# Patient Record
Sex: Male | Born: 1955 | Race: White | Hispanic: No | Marital: Married | Smoking: Current every day smoker
Health system: Southern US, Community
[De-identification: ages and names within clinical notes are randomized; demographics above are authoritative.]

## PROBLEM LIST (undated history)

## (undated) DIAGNOSIS — IMO0001 Reserved for inherently not codable concepts without codable children: Secondary | ICD-10-CM

## (undated) DIAGNOSIS — E7800 Pure hypercholesterolemia, unspecified: Secondary | ICD-10-CM

## (undated) DIAGNOSIS — R251 Tremor, unspecified: Secondary | ICD-10-CM

## (undated) DIAGNOSIS — K5792 Diverticulitis of intestine, part unspecified, without perforation or abscess without bleeding: Secondary | ICD-10-CM

## (undated) DIAGNOSIS — I10 Essential (primary) hypertension: Secondary | ICD-10-CM

## (undated) DIAGNOSIS — E785 Hyperlipidemia, unspecified: Secondary | ICD-10-CM

## (undated) DIAGNOSIS — D696 Thrombocytopenia, unspecified: Secondary | ICD-10-CM

## (undated) DIAGNOSIS — K635 Polyp of colon: Secondary | ICD-10-CM

## (undated) DIAGNOSIS — I2510 Atherosclerotic heart disease of native coronary artery without angina pectoris: Secondary | ICD-10-CM

## (undated) DIAGNOSIS — N200 Calculus of kidney: Secondary | ICD-10-CM

## (undated) HISTORY — DX: Essential (primary) hypertension: I10

## (undated) HISTORY — DX: Hyperlipidemia, unspecified: E78.5

## (undated) HISTORY — PX: CARDIAC CATHETERIZATION: SHX172

## (undated) HISTORY — DX: Tremor, unspecified: R25.1

## (undated) HISTORY — DX: Diverticulitis of intestine, part unspecified, without perforation or abscess without bleeding: K57.92

## (undated) HISTORY — DX: Atherosclerotic heart disease of native coronary artery without angina pectoris: I25.10

## (undated) HISTORY — DX: Thrombocytopenia, unspecified: D69.6

## (undated) HISTORY — DX: Reserved for inherently not codable concepts without codable children: IMO0001

## (undated) HISTORY — DX: Calculus of kidney: N20.0

## (undated) HISTORY — DX: Pure hypercholesterolemia, unspecified: E78.00

## (undated) HISTORY — DX: Polyp of colon: K63.5

---

## 1988-12-27 HISTORY — PX: VASECTOMY: SHX75

## 2003-01-31 ENCOUNTER — Ambulatory Visit (HOSPITAL_COMMUNITY): Admission: RE | Admit: 2003-01-31 | Discharge: 2003-01-31 | Payer: Self-pay | Admitting: Gastroenterology

## 2006-05-15 ENCOUNTER — Encounter: Admission: RE | Admit: 2006-05-15 | Discharge: 2006-05-15 | Payer: Self-pay | Admitting: Cardiology

## 2006-05-25 ENCOUNTER — Ambulatory Visit (HOSPITAL_COMMUNITY): Admission: RE | Admit: 2006-05-25 | Discharge: 2006-05-25 | Payer: Self-pay | Admitting: Cardiology

## 2013-03-19 ENCOUNTER — Encounter: Payer: Self-pay | Admitting: *Deleted

## 2013-03-19 ENCOUNTER — Encounter: Payer: Self-pay | Admitting: Cardiology

## 2013-03-19 DIAGNOSIS — D696 Thrombocytopenia, unspecified: Secondary | ICD-10-CM | POA: Insufficient documentation

## 2013-03-19 DIAGNOSIS — E785 Hyperlipidemia, unspecified: Secondary | ICD-10-CM | POA: Insufficient documentation

## 2013-03-19 DIAGNOSIS — K635 Polyp of colon: Secondary | ICD-10-CM | POA: Insufficient documentation

## 2013-03-19 DIAGNOSIS — I10 Essential (primary) hypertension: Secondary | ICD-10-CM | POA: Insufficient documentation

## 2013-03-19 DIAGNOSIS — R251 Tremor, unspecified: Secondary | ICD-10-CM | POA: Insufficient documentation

## 2013-03-19 DIAGNOSIS — E7800 Pure hypercholesterolemia, unspecified: Secondary | ICD-10-CM | POA: Insufficient documentation

## 2013-03-19 DIAGNOSIS — N200 Calculus of kidney: Secondary | ICD-10-CM | POA: Insufficient documentation

## 2013-03-19 DIAGNOSIS — I2510 Atherosclerotic heart disease of native coronary artery without angina pectoris: Secondary | ICD-10-CM | POA: Insufficient documentation

## 2013-03-19 DIAGNOSIS — IMO0001 Reserved for inherently not codable concepts without codable children: Secondary | ICD-10-CM | POA: Insufficient documentation

## 2013-03-22 ENCOUNTER — Encounter: Payer: Self-pay | Admitting: Cardiology

## 2013-03-22 ENCOUNTER — Ambulatory Visit (INDEPENDENT_AMBULATORY_CARE_PROVIDER_SITE_OTHER): Payer: BC Managed Care – PPO | Admitting: Cardiology

## 2013-03-22 VITALS — BP 155/93 | HR 76 | Ht 71.0 in | Wt 191.0 lb

## 2013-03-22 DIAGNOSIS — I10 Essential (primary) hypertension: Secondary | ICD-10-CM

## 2013-03-22 DIAGNOSIS — I2510 Atherosclerotic heart disease of native coronary artery without angina pectoris: Secondary | ICD-10-CM

## 2013-03-22 DIAGNOSIS — E7800 Pure hypercholesterolemia, unspecified: Secondary | ICD-10-CM

## 2013-03-22 NOTE — Progress Notes (Signed)
  [address] [place], [place]  [address] Phone: [phone] Fax:  [phone]  Date:  03/22/2013   ID:  HABEEB PUERTAS, DOB 1955/06/27, MRN [id number]  PCP:  No primary Crissie Aloi on file.  Cardiologist:  Armanda Magic, MD   History of Present Illness: Roberto Tucker is a 57 y.o. male with a history of ASCAD, HTN and dyslipidemia.  He denies and chest pain, LE edema, dizziness, palpitations or syncope.  He has chronic SOB due to smoking which is unchanged.   Wt Readings from Last 3 Encounters:  03/22/13 191 lb (86.637 kg)     Past Medical History  Diagnosis Date  . Hypercholesteremia   . Coronary atherosclerosis of native coronary artery   . HTN (hypertension)   . Myalgia and myositis, unspecified   . Colon polyp   . Tremor   . Thrombocytopenia   . CAD (coronary artery disease)     nonobstructive CAD followed by cardiologist Dr. Mayford Knife, coronary angiography in January 2008 because of abnormal Cardiolite test, showing 20% ostial narrowing and luminal irregularities throughout the course of the LAD and RCA, ejection fraction 55%  . Hyperlipidemia   . Kidney stone     Current Outpatient Prescriptions  Medication Sig Dispense Refill  . aspirin 81 MG tablet Take 81 mg by mouth daily.      Marland Kitchen atorvastatin (LIPITOR) 80 MG tablet Take 80 mg by mouth daily.      . propranolol (INDERAL) 10 MG tablet Take 10 mg by mouth 2 (two) times daily.        No current facility-administered medications for this visit.    Allergies:   No Known Allergies  Social History:  The patient  reports that he has been smoking.  He does not have any smokeless tobacco history on file. He reports that he drinks about 6.0 ounces of alcohol per week. He reports that he does not use illicit drugs.   Family History:  The patient's family history includes Colon cancer in his mother; Heart attack in his father; Heart disease in his father.   ROS:  Please see the history of present illness.      All other  systems reviewed and negative.   PHYSICAL EXAM: VS:  BP 155/93  Pulse 76  Ht 5\' 11"  (1.803 m)  Wt 191 lb (86.637 kg)  BMI 26.65 kg/m2 Well nourished, well developed, in no acute distress HEENT: normal Neck: no JVD Cardiac:  normal S1, S2; RRR; no murmur Lungs:  clear to auscultation bilaterally, no wheezing, rhonchi or rales Abd: soft, nontender, no hepatomegaly Ext: no edema Skin: warm and dry Neuro:  CNs 2-12 intact, no focal abnormalities noted  EKG:  NSR with no ST changes     ASSESSMENT AND PLAN:  1. ASCAD with no angina  - continue ASA 2. HTN - BP elevated today but at home it was 128/52mmHg  - continue Inderal 3. Dyslipidemia  - continue atorvastatin  - check fasting lipid panel and ALT 4.  Tobacco abuse ongoing - he has tried all the smoking cessation drugs and does not want to try Chantix  Followup with me in 1 year  Signed, Armanda Magic, MD 03/22/2013 1:49 PM

## 2013-03-22 NOTE — Patient Instructions (Addendum)
Your physician recommends that you continue on your current medications as directed. Please refer to the Current Medication list given to you today.  Your physician recommends that you return for lab work in: 1 Week on 03/29/13. Please come fasting for you LIPID and ALT Panel   Your physician wants you to follow-up in: 1 Year with Dr. Sherlyn Lick will receive a reminder letter in the mail two months in advance. If you don't receive a letter, please call our office to schedule the follow-up appointment.

## 2013-03-29 ENCOUNTER — Other Ambulatory Visit (INDEPENDENT_AMBULATORY_CARE_PROVIDER_SITE_OTHER): Payer: BC Managed Care – PPO

## 2013-03-29 DIAGNOSIS — E7800 Pure hypercholesterolemia, unspecified: Secondary | ICD-10-CM

## 2013-03-29 LAB — LIPID PANEL
Cholesterol: 137 mg/dL (ref 0–200)
HDL: 34.9 mg/dL — ABNORMAL LOW (ref >39.00)
VLDL: 24.8 mg/dL (ref 0.0–40.0)

## 2013-03-31 ENCOUNTER — Other Ambulatory Visit: Payer: Self-pay | Admitting: General Surgery

## 2013-03-31 ENCOUNTER — Encounter: Payer: Self-pay | Admitting: General Surgery

## 2013-03-31 DIAGNOSIS — I2510 Atherosclerotic heart disease of native coronary artery without angina pectoris: Secondary | ICD-10-CM

## 2013-03-31 DIAGNOSIS — Z79899 Other long term (current) drug therapy: Secondary | ICD-10-CM

## 2013-05-11 ENCOUNTER — Other Ambulatory Visit: Payer: Self-pay | Admitting: Family Medicine

## 2013-05-11 DIAGNOSIS — F17200 Nicotine dependence, unspecified, uncomplicated: Secondary | ICD-10-CM

## 2013-05-18 ENCOUNTER — Ambulatory Visit
Admission: RE | Admit: 2013-05-18 | Discharge: 2013-05-18 | Disposition: A | Discharge status: Home / Self Care | Payer: No Typology Code available for payment source | Source: Ambulatory Visit | Attending: Family Medicine | Admitting: Family Medicine

## 2013-05-18 DIAGNOSIS — F17200 Nicotine dependence, unspecified, uncomplicated: Secondary | ICD-10-CM

## 2013-07-29 ENCOUNTER — Other Ambulatory Visit: Payer: BC Managed Care – PPO

## 2013-08-04 ENCOUNTER — Ambulatory Visit: Payer: BC Managed Care – PPO | Admitting: Pharmacist

## 2014-01-31 ENCOUNTER — Encounter: Payer: Self-pay | Admitting: Cardiology

## 2014-02-11 IMAGING — CT CT CHEST NODULE FOLLOW UP LOW DOSE W/O CM
1 of 6 series · 15 of 37 positions shown, 19 images · non-contrast
Comparison: DG CHEST 2V dated 02/11/2012; CT-CHEST dated 03/17/2006

CLINICAL DATA: Lung screening, smoker.

EXAM:
CT CHEST SCREENING WITHOUT CONTRAST
TECHNIQUE: Multidetector CT imaging of the chest was performed following the
standard low-dose protocol without IV contrast.

[Series 3: lung windows · axial · 0.70mm/px · z∈[-325,-14]mm · 15 of 277 slices shown, 19 images]
[im 14/277  mediastinal]
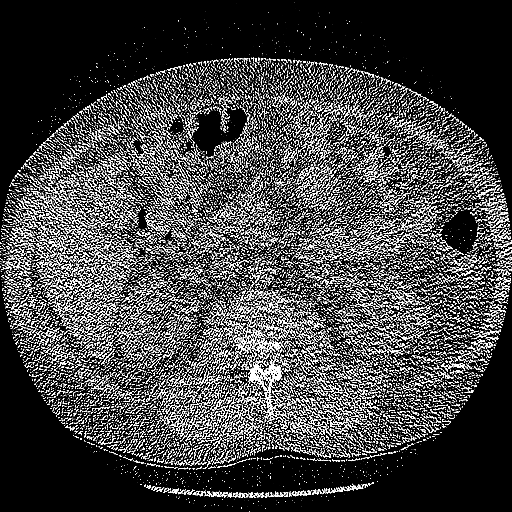
[im 14/277  lung]
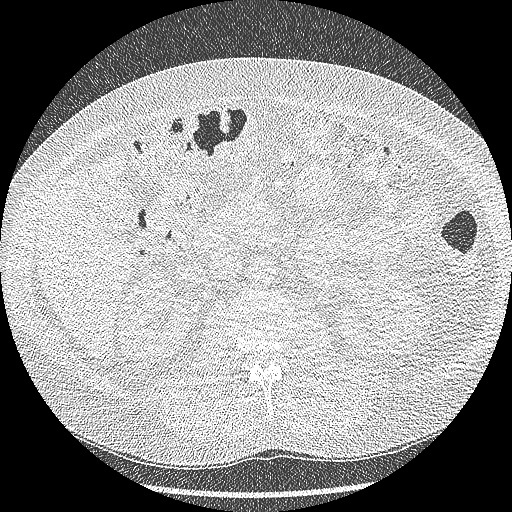
[im 40/277  lung]
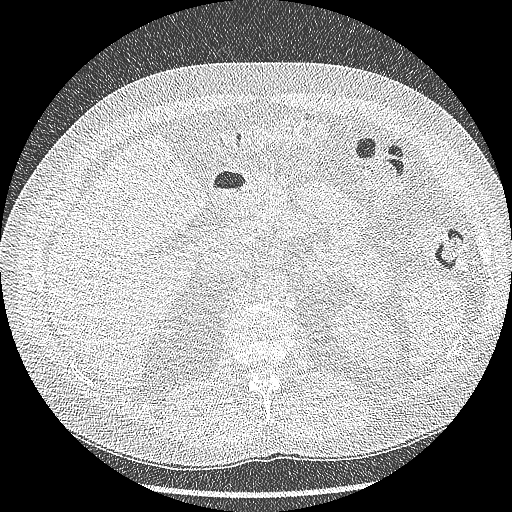
[im 53/277  lung]
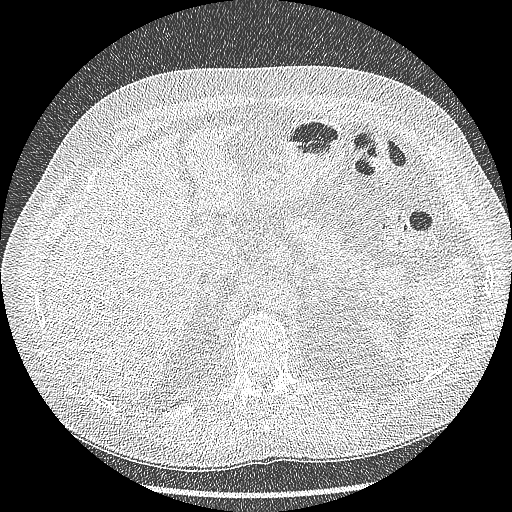
[im 66/277  lung]
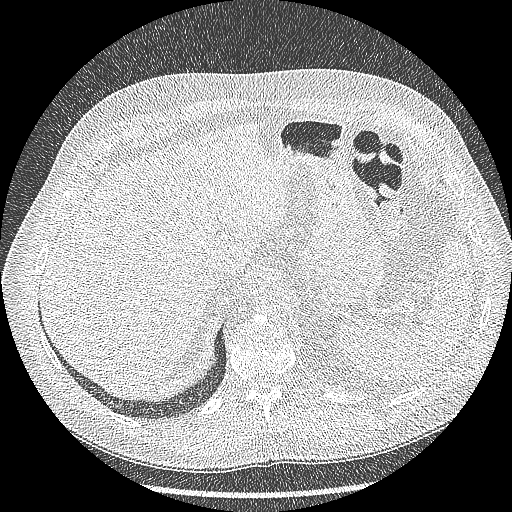
[im 93/277  mediastinal]
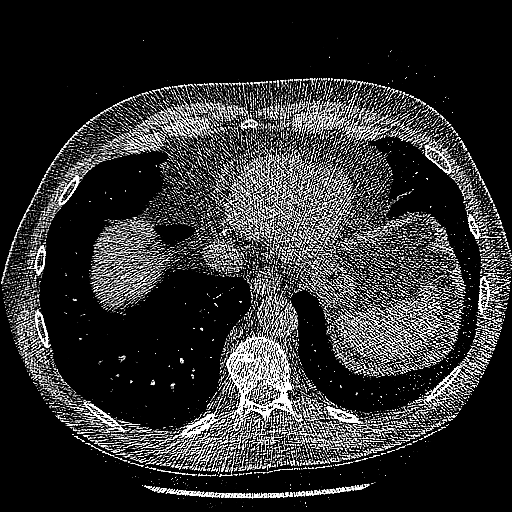
[im 93/277  lung]
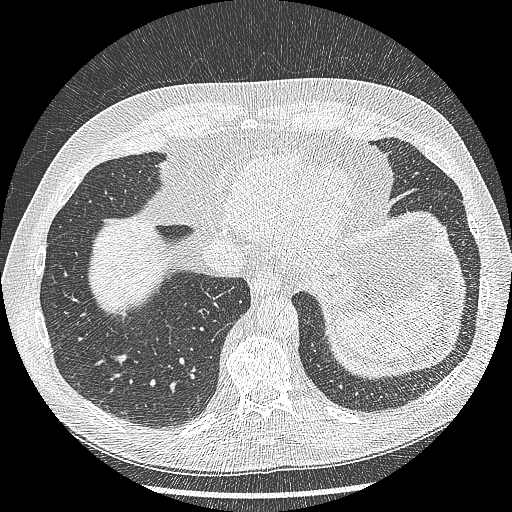
[im 106/277  lung]
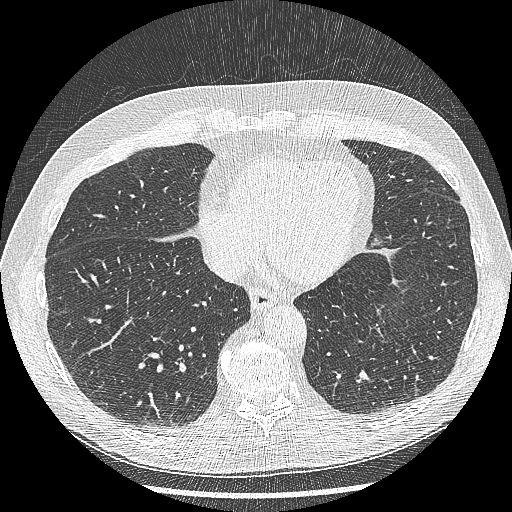
[im 119/277  lung]
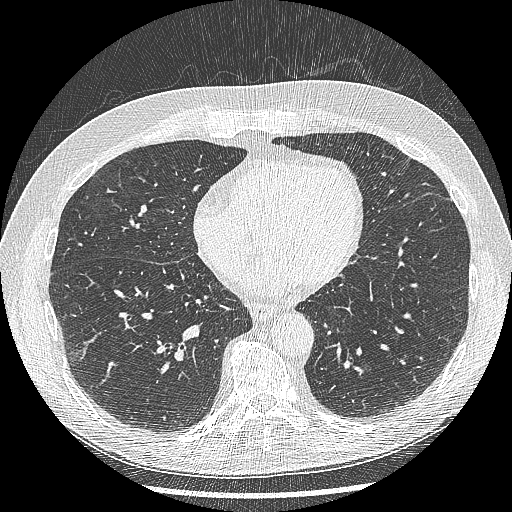
[im 145/277  lung]
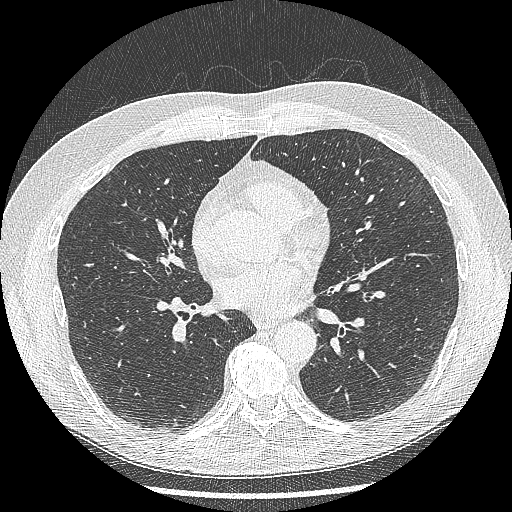
[im 158/277  mediastinal]
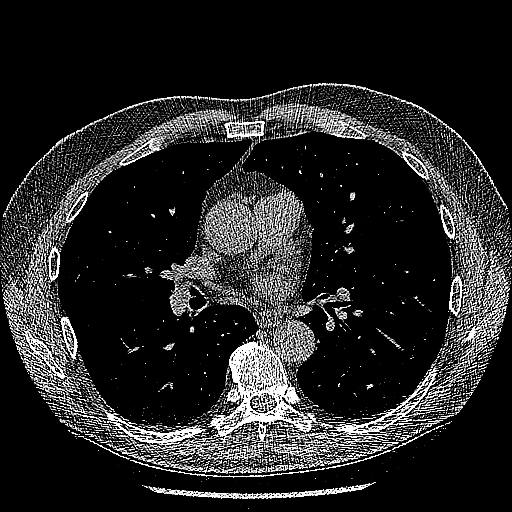
[im 158/277  lung]
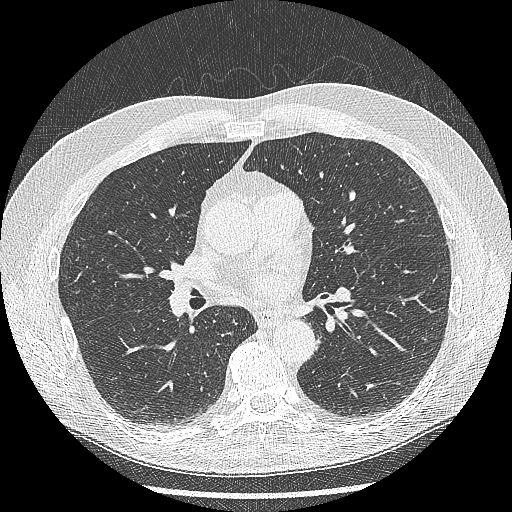
[im 171/277  lung]
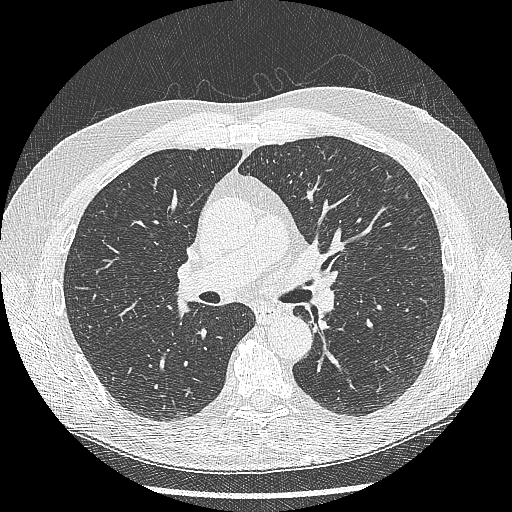
[im 198/277  lung]
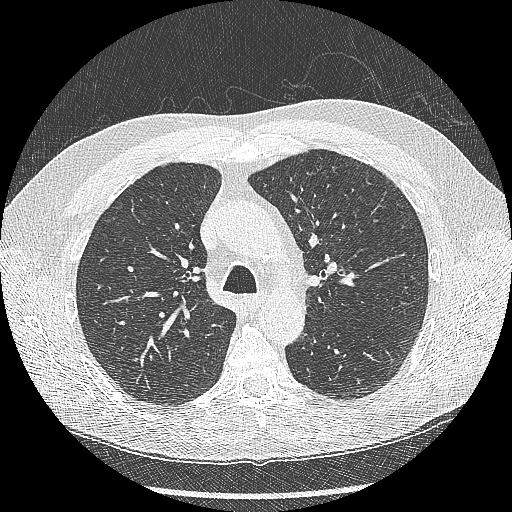
[im 211/277  lung]
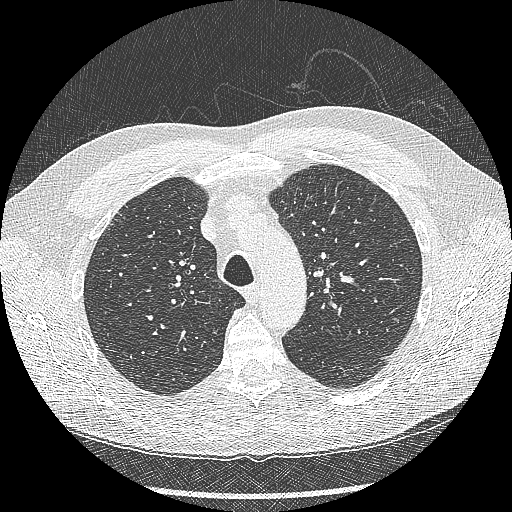
[im 224/277  mediastinal]
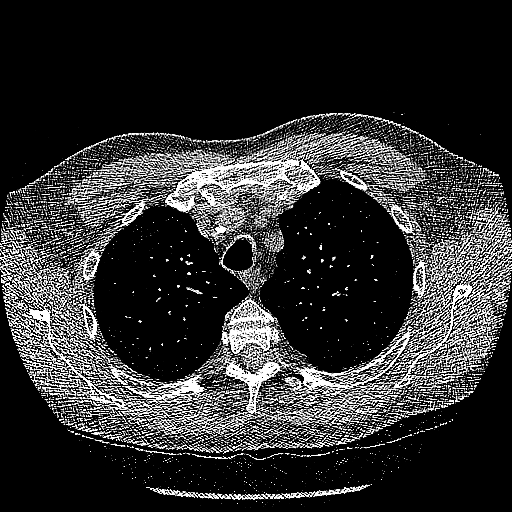
[im 224/277  lung]
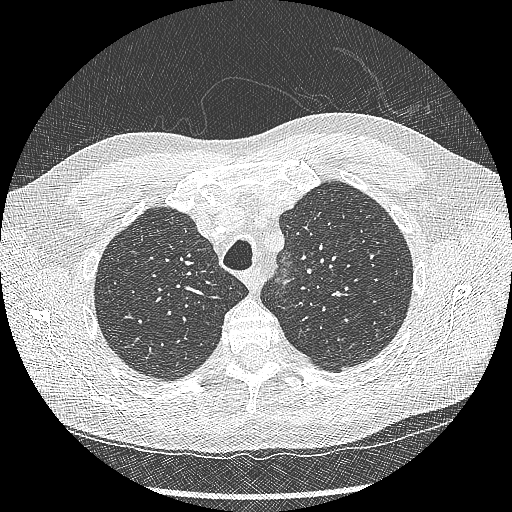
[im 250/277  lung]
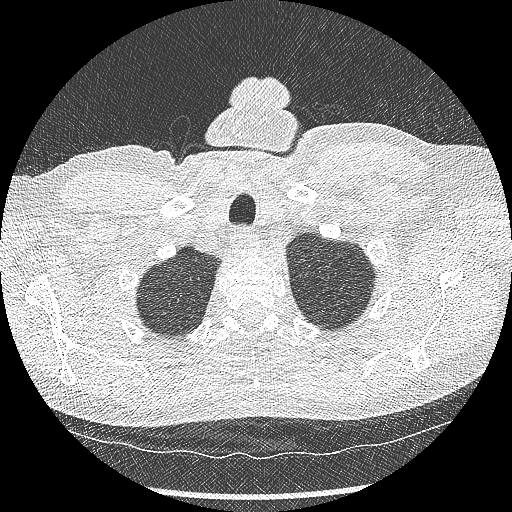
[im 263/277  lung]
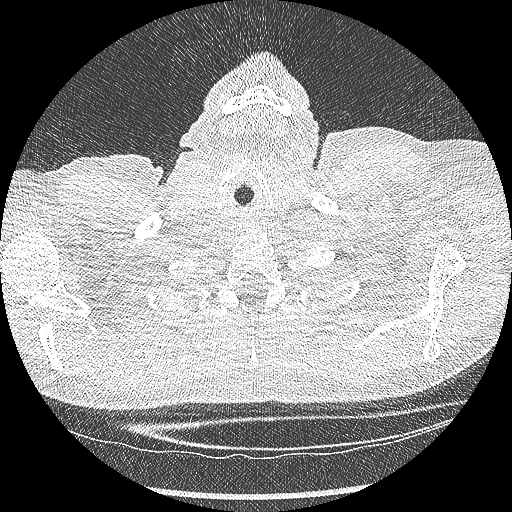

[15 of 37 positions shown; findings below may reference images not displayed]

FINDINGS: No pathologically enlarged mediastinal, hilar or axillary lymph
nodes. Atherosclerotic calcification of the arterial vasculature,
including three-vessel involvement of the coronary arteries. Heart
size normal. No pericardial effusion.

A vague area of subpleural ground-glass in the left lower lobe
(series 3, image 151) does not have well-defined borders and is
therefore not considered a nodule. 1 mm subpleural nodules in the
left lower lobe (images 99 and 117) appear stable from 03/17/2006.
No worrisome pulmonary nodules. No pleural fluid. Airway is
unremarkable.

Incidental imaging of the upper abdomen shows a 1.5 cm
low-attenuation lesion in the posterior right hepatic lobe, stable
from 03/17/2006 and likely a cyst. No worrisome lytic or sclerotic
lesions. Degenerative changes are seen in the spine.
IMPRESSION: 1. Negative screening examination. Lung-RADS Category 1, negative.
Continue annual screening with low-dose chest CT without contrast in
12 months. These results will be called to the ordering clinician or
representative by the Radiologist Assistant, and communication
documented in the PACS Dashboard.
2. Three-vessel coronary artery calcification.

## 2014-11-22 ENCOUNTER — Other Ambulatory Visit: Payer: Self-pay | Admitting: Gastroenterology

## 2015-10-23 ENCOUNTER — Encounter: Payer: Self-pay | Admitting: *Deleted

## 2015-10-24 ENCOUNTER — Encounter: Payer: BLUE CROSS/BLUE SHIELD | Admitting: Diagnostic Neuroimaging

## 2015-10-24 ENCOUNTER — Encounter: Payer: Self-pay | Admitting: Diagnostic Neuroimaging

## 2015-10-31 NOTE — Progress Notes (Signed)
This encounter was created in error - please disregard.

## 2015-11-14 ENCOUNTER — Other Ambulatory Visit: Payer: Self-pay | Admitting: Gastroenterology

## 2020-10-10 DIAGNOSIS — Z1211 Encounter for screening for malignant neoplasm of colon: Secondary | ICD-10-CM | POA: Diagnosis not present

## 2020-10-10 DIAGNOSIS — E7800 Pure hypercholesterolemia, unspecified: Secondary | ICD-10-CM | POA: Diagnosis not present

## 2020-10-10 DIAGNOSIS — G250 Essential tremor: Secondary | ICD-10-CM | POA: Diagnosis not present

## 2020-10-10 DIAGNOSIS — Z23 Encounter for immunization: Secondary | ICD-10-CM | POA: Diagnosis not present

## 2020-10-10 DIAGNOSIS — R69 Illness, unspecified: Secondary | ICD-10-CM | POA: Diagnosis not present

## 2020-10-10 DIAGNOSIS — I10 Essential (primary) hypertension: Secondary | ICD-10-CM | POA: Diagnosis not present

## 2020-10-10 DIAGNOSIS — I2510 Atherosclerotic heart disease of native coronary artery without angina pectoris: Secondary | ICD-10-CM | POA: Diagnosis not present

## 2020-10-10 DIAGNOSIS — Z0000 Encounter for general adult medical examination without abnormal findings: Secondary | ICD-10-CM | POA: Diagnosis not present

## 2020-12-06 DIAGNOSIS — Z1211 Encounter for screening for malignant neoplasm of colon: Secondary | ICD-10-CM | POA: Diagnosis not present

## 2021-10-15 DIAGNOSIS — E7800 Pure hypercholesterolemia, unspecified: Secondary | ICD-10-CM | POA: Diagnosis not present

## 2021-10-15 DIAGNOSIS — Z0000 Encounter for general adult medical examination without abnormal findings: Secondary | ICD-10-CM | POA: Diagnosis not present

## 2021-10-15 DIAGNOSIS — I10 Essential (primary) hypertension: Secondary | ICD-10-CM | POA: Diagnosis not present

## 2021-10-15 DIAGNOSIS — G250 Essential tremor: Secondary | ICD-10-CM | POA: Diagnosis not present

## 2021-10-15 DIAGNOSIS — Z1159 Encounter for screening for other viral diseases: Secondary | ICD-10-CM | POA: Diagnosis not present

## 2021-10-15 DIAGNOSIS — I2510 Atherosclerotic heart disease of native coronary artery without angina pectoris: Secondary | ICD-10-CM | POA: Diagnosis not present

## 2021-10-15 DIAGNOSIS — Z125 Encounter for screening for malignant neoplasm of prostate: Secondary | ICD-10-CM | POA: Diagnosis not present

## 2021-10-15 DIAGNOSIS — R69 Illness, unspecified: Secondary | ICD-10-CM | POA: Diagnosis not present

## 2022-02-24 DIAGNOSIS — U071 COVID-19: Secondary | ICD-10-CM | POA: Diagnosis not present

## 2022-10-23 DIAGNOSIS — Z23 Encounter for immunization: Secondary | ICD-10-CM | POA: Diagnosis not present

## 2022-10-23 DIAGNOSIS — D696 Thrombocytopenia, unspecified: Secondary | ICD-10-CM | POA: Diagnosis not present

## 2022-10-23 DIAGNOSIS — Z86010 Personal history of colonic polyps: Secondary | ICD-10-CM | POA: Diagnosis not present

## 2022-10-23 DIAGNOSIS — Z0000 Encounter for general adult medical examination without abnormal findings: Secondary | ICD-10-CM | POA: Diagnosis not present

## 2022-10-23 DIAGNOSIS — G250 Essential tremor: Secondary | ICD-10-CM | POA: Diagnosis not present

## 2022-10-23 DIAGNOSIS — F17210 Nicotine dependence, cigarettes, uncomplicated: Secondary | ICD-10-CM | POA: Diagnosis not present

## 2022-10-23 DIAGNOSIS — I10 Essential (primary) hypertension: Secondary | ICD-10-CM | POA: Diagnosis not present

## 2022-10-23 DIAGNOSIS — I2510 Atherosclerotic heart disease of native coronary artery without angina pectoris: Secondary | ICD-10-CM | POA: Diagnosis not present

## 2022-10-23 DIAGNOSIS — E7800 Pure hypercholesterolemia, unspecified: Secondary | ICD-10-CM | POA: Diagnosis not present

## 2023-09-08 DIAGNOSIS — H01021 Squamous blepharitis right upper eyelid: Secondary | ICD-10-CM | POA: Diagnosis not present

## 2023-09-08 DIAGNOSIS — B880 Other acariasis: Secondary | ICD-10-CM | POA: Diagnosis not present

## 2023-09-08 DIAGNOSIS — H01024 Squamous blepharitis left upper eyelid: Secondary | ICD-10-CM | POA: Diagnosis not present

## 2023-09-08 DIAGNOSIS — H2513 Age-related nuclear cataract, bilateral: Secondary | ICD-10-CM | POA: Diagnosis not present

## 2023-10-01 DIAGNOSIS — H2511 Age-related nuclear cataract, right eye: Secondary | ICD-10-CM | POA: Diagnosis not present

## 2023-10-01 DIAGNOSIS — Z01818 Encounter for other preprocedural examination: Secondary | ICD-10-CM | POA: Diagnosis not present

## 2023-10-01 DIAGNOSIS — H2512 Age-related nuclear cataract, left eye: Secondary | ICD-10-CM | POA: Diagnosis not present

## 2023-10-15 DIAGNOSIS — H2511 Age-related nuclear cataract, right eye: Secondary | ICD-10-CM | POA: Diagnosis not present

## 2023-10-15 DIAGNOSIS — Z87891 Personal history of nicotine dependence: Secondary | ICD-10-CM | POA: Diagnosis not present

## 2023-10-15 DIAGNOSIS — I10 Essential (primary) hypertension: Secondary | ICD-10-CM | POA: Diagnosis not present

## 2023-10-29 DIAGNOSIS — D696 Thrombocytopenia, unspecified: Secondary | ICD-10-CM | POA: Diagnosis not present

## 2023-10-29 DIAGNOSIS — E7800 Pure hypercholesterolemia, unspecified: Secondary | ICD-10-CM | POA: Diagnosis not present

## 2023-10-29 DIAGNOSIS — I10 Essential (primary) hypertension: Secondary | ICD-10-CM | POA: Diagnosis not present

## 2023-11-05 DIAGNOSIS — H2512 Age-related nuclear cataract, left eye: Secondary | ICD-10-CM | POA: Diagnosis not present

## 2023-11-05 DIAGNOSIS — J449 Chronic obstructive pulmonary disease, unspecified: Secondary | ICD-10-CM | POA: Diagnosis not present
